# Patient Record
Sex: Male | Born: 1975 | Race: White | Hispanic: No | Marital: Married | Smoking: Current every day smoker
Health system: Southern US, Community
[De-identification: ages and names within clinical notes are randomized; demographics above are authoritative.]

## PROBLEM LIST (undated history)

## (undated) DIAGNOSIS — Z8711 Personal history of peptic ulcer disease: Secondary | ICD-10-CM

## (undated) DIAGNOSIS — Z8719 Personal history of other diseases of the digestive system: Secondary | ICD-10-CM

---

## 2007-01-27 ENCOUNTER — Ambulatory Visit: Payer: Self-pay | Admitting: Family Medicine

## 2010-01-31 ENCOUNTER — Ambulatory Visit: Payer: Self-pay | Admitting: Internal Medicine

## 2013-02-15 ENCOUNTER — Ambulatory Visit: Payer: Self-pay

## 2013-02-26 ENCOUNTER — Ambulatory Visit: Payer: Self-pay

## 2016-12-02 ENCOUNTER — Emergency Department
Admission: EM | Admit: 2016-12-02 | Discharge: 2016-12-02 | Disposition: A | Discharge status: Home / Self Care | Payer: Self-pay | Attending: Student in an Organized Health Care Education/Training Program | Admitting: Student in an Organized Health Care Education/Training Program

## 2016-12-02 ENCOUNTER — Encounter: Payer: Self-pay | Admitting: Emergency Medicine

## 2016-12-02 ENCOUNTER — Emergency Department: Payer: Self-pay

## 2016-12-02 DIAGNOSIS — F17200 Nicotine dependence, unspecified, uncomplicated: Secondary | ICD-10-CM | POA: Insufficient documentation

## 2016-12-02 DIAGNOSIS — K920 Hematemesis: Secondary | ICD-10-CM | POA: Insufficient documentation

## 2016-12-02 HISTORY — DX: Personal history of peptic ulcer disease: Z87.11

## 2016-12-02 HISTORY — DX: Personal history of other diseases of the digestive system: Z87.19

## 2016-12-02 LAB — COMPREHENSIVE METABOLIC PANEL
ALK PHOS: 50 U/L (ref 38–126)
ALT: 19 U/L (ref 17–63)
AST: 23 U/L (ref 15–41)
Albumin: 4.4 g/dL (ref 3.5–5.0)
Anion gap: 8 (ref 5–15)
BUN: 13 mg/dL (ref 6–20)
CALCIUM: 9.1 mg/dL (ref 8.9–10.3)
CO2: 25 mmol/L (ref 22–32)
CREATININE: 0.88 mg/dL (ref 0.61–1.24)
Chloride: 105 mmol/L (ref 101–111)
Glucose, Bld: 105 mg/dL — ABNORMAL HIGH (ref 65–99)
Potassium: 4 mmol/L (ref 3.5–5.1)
Sodium: 138 mmol/L (ref 135–145)
Total Bilirubin: 0.6 mg/dL (ref 0.3–1.2)
Total Protein: 7.1 g/dL (ref 6.5–8.1)

## 2016-12-02 LAB — CBC
HCT: 44.3 % (ref 40.0–52.0)
Hemoglobin: 15.3 g/dL (ref 13.0–18.0)
MCH: 32.1 pg (ref 26.0–34.0)
MCHC: 34.5 g/dL (ref 32.0–36.0)
MCV: 93 fL (ref 80.0–100.0)
PLATELETS: 234 10*3/uL (ref 150–440)
RBC: 4.76 MIL/uL (ref 4.40–5.90)
RDW: 13.2 % (ref 11.5–14.5)
WBC: 7.4 10*3/uL (ref 3.8–10.6)

## 2016-12-02 LAB — LIPASE, BLOOD: LIPASE: 22 U/L (ref 11–51)

## 2016-12-02 LAB — TYPE AND SCREEN
ABO/RH(D): A POS
Antibody Screen: NEGATIVE

## 2016-12-02 MED ORDER — GI COCKTAIL ~~LOC~~
ORAL | Status: AC
Start: 2016-12-02 — End: 2016-12-02
  Administered 2016-12-02: 30 mL via ORAL
  Filled 2016-12-02: qty 30

## 2016-12-02 MED ORDER — OMEPRAZOLE 20 MG PO CPDR: 20.0000 mg | DELAYED_RELEASE_CAPSULE | Freq: Two times a day (BID) | ORAL | 1 refills | Status: AC

## 2016-12-02 MED ORDER — GI COCKTAIL ~~LOC~~
30.0000 mL | Freq: Once | ORAL | Status: AC
  Administered 2016-12-02: 30 mL via ORAL

## 2016-12-02 NOTE — ED Notes (Signed)

## 2016-12-02 NOTE — ED Provider Notes (Signed)
[facility] Emergency Department Provider Note    None    (approximate)  I have reviewed the triage vital signs and the nursing notes.   HISTORY  Chief Complaint Hematemesis    HPI Ian Scott is a 41 y.o. male presents with long history of epigastric pain and gastritis presents with the episode of blood in his vomit. Patient states that he frequently vomits every morning. Drinks several cans of beer every night and does endorse smoking marijuana recreationally. Denies any pain at this time. Has not noted any blood in his stool or melena. He is not on any blood thinners. Does admit to smoking. Has undergone a lot of stress recently.  Does not have a history of heart failure. States she otherwise feels well and "healthy as a horse "    Past Medical History:  Diagnosis Date  . History of stomach ulcers    No family history on file. History reviewed. No pertinent surgical history. There are no active problems to display for this patient.     Prior to Admission medications   Not on File    Allergies Patient has no known allergies.    Social History Social History  Substance Use Topics  . Smoking status: Current Every Day Smoker  . Smokeless tobacco: Never Used  . Alcohol use Yes    Review of Systems Patient denies headaches, rhinorrhea, blurry vision, numbness, shortness of breath, chest pain, edema, cough, abdominal pain, nausea, vomiting, diarrhea, dysuria, fevers, rashes or hallucinations unless otherwise stated above in HPI. ____________________________________________   PHYSICAL EXAM:  VITAL SIGNS: Vitals:   12/02/16 1059  BP: 126/89  Pulse: 71  Resp: 18  Temp: 97.7 F (36.5 C)    Constitutional: Alert and oriented. Well appearing and in no acute distress. Eyes: Conjunctivae are normal.  Head: Atraumatic. Nose: No congestion/rhinnorhea. Mouth/Throat: Mucous membranes are moist.   Neck: No stridor. Painless ROM.    Cardiovascular: Normal rate, regular rhythm. Grossly normal heart sounds.  Good peripheral circulation. Respiratory: Normal respiratory effort.  No retractions. Lungs CTAB. Gastrointestinal: Soft and nontender. No distention. No abdominal bruits. No CVA tenderness. Genitourinary:  Musculoskeletal: No lower extremity tenderness nor edema.  No joint effusions. Neurologic:  Normal speech and language. No gross focal neurologic deficits are appreciated. No facial droop Skin:  Skin is warm, dry and intact. No rash noted. Psychiatric: Mood and affect are normal. Speech and behavior are normal.  ____________________________________________   LABS (all labs ordered are listed, but only abnormal results are displayed)  Results for orders placed or performed during the hospital encounter of 12/02/16 (from the past 24 hour(s))  Comprehensive metabolic panel     Status: Abnormal   Collection Time: 12/02/16 11:05 AM  Result Value Ref Range   Sodium 138 135 - 145 mmol/L   Potassium 4.0 3.5 - 5.1 mmol/L   Chloride 105 101 - 111 mmol/L   CO2 25 22 - 32 mmol/L   Glucose, Bld 105 (H) 65 - 99 mg/dL   BUN 13 6 - 20 mg/dL   Creatinine, Ser 1.61 0.61 - 1.24 mg/dL   Calcium 9.1 8.9 - 09.6 mg/dL   Total Protein 7.1 6.5 - 8.1 g/dL   Albumin 4.4 3.5 - 5.0 g/dL   AST 23 15 - 41 U/L   ALT 19 17 - 63 U/L   Alkaline Phosphatase 50 38 - 126 U/L   Total Bilirubin 0.6 0.3 - 1.2 mg/dL   GFR calc non Af  Amer >60 >60 mL/min   GFR calc Af Amer >60 >60 mL/min   Anion gap 8 5 - 15  CBC     Status: None   Collection Time: 12/02/16 11:05 AM  Result Value Ref Range   WBC 7.4 3.8 - 10.6 K/uL   RBC 4.76 4.40 - 5.90 MIL/uL   Hemoglobin 15.3 13.0 - 18.0 g/dL   HCT 16.144.3 09.640.0 - 04.552.0 %   MCV 93.0 80.0 - 100.0 fL   MCH 32.1 26.0 - 34.0 pg   MCHC 34.5 32.0 - 36.0 g/dL   RDW 40.913.2 81.111.5 - 91.414.5 %   Platelets 234 150 - 440 K/uL  Type and screen [facility]     Status: None   Collection Time: 12/02/16  11:05 AM  Result Value Ref Range   ABO/RH(D) A POS    Antibody Screen NEG    Sample Expiration 12/05/2016    ____________________________________________ ____________________________________________  RADIOLOGY  I personally reviewed all radiographic images ordered to evaluate for the above acute complaints and reviewed radiology reports and findings.  These findings were personally discussed with the patient.  Please see medical record for radiology report.  ____________________________________________   PROCEDURES  Procedure(s) performed:  Procedures    Critical Care performed: no ____________________________________________   INITIAL IMPRESSION / ASSESSMENT AND PLAN / ED COURSE  Pertinent labs & imaging results that were available during my care of the patient were reviewed by me and considered in my medical decision making (see chart for details).  DDX: gastritis, pacnreatitis, pud, esophagitis, hepatitis   Ian Scott is a 41 y.o. who presents to the ED with Nausea vomiting and hematemesis as described above. He is afebrile and very well-appearing clinically likely secondary to alcohol use and tobacco use. Patient does have risk factors for peptic ulcer disease and also admits to increased stress however he does not have evidence of active bleeding at this time.  His Glasgow-Blatchford score is 0.  No evidence of pneumomediastinum. Patient had some improvement in symptoms after GI cocktail. Patient will be started on Prilosec and referred to GI for further evaluation.  10 minutes of smoking and alcohol cessation counseling provided.  Have discussed with the patient and available family all diagnostics and treatments performed thus far and all questions were answered to the best of my ability. The patient demonstrates understanding and agreement with plan.       ____________________________________________   FINAL CLINICAL IMPRESSION(S) / ED DIAGNOSES  Final  diagnoses:  Hematemesis with nausea      NEW MEDICATIONS STARTED DURING THIS VISIT:  New Prescriptions   No medications on file     Note:  This document was prepared using Dragon voice recognition software and may include unintentional dictation errors.    Willy Eddyobinson, Raahi Korber, MD 12/02/16 [phone]

## 2016-12-02 NOTE — ED Triage Notes (Signed)
Vomiting every morning x4 days with blood tinge, today large amount emesis, stomach pain

## 2021-04-25 ENCOUNTER — Emergency Department: Payer: Self-pay

## 2021-04-25 ENCOUNTER — Emergency Department
Admission: EM | Admit: 2021-04-25 | Discharge: 2021-04-25 | Disposition: A | Discharge status: Other Acute Inpatient Hospital | Payer: Self-pay | Attending: Emergency Medicine | Admitting: Emergency Medicine

## 2021-04-25 DIAGNOSIS — S82402B Unspecified fracture of shaft of left fibula, initial encounter for open fracture type I or II: Secondary | ICD-10-CM | POA: Insufficient documentation

## 2021-04-25 DIAGNOSIS — S82401B Unspecified fracture of shaft of right fibula, initial encounter for open fracture type I or II: Secondary | ICD-10-CM

## 2021-04-25 DIAGNOSIS — F17200 Nicotine dependence, unspecified, uncomplicated: Secondary | ICD-10-CM | POA: Insufficient documentation

## 2021-04-25 DIAGNOSIS — S82201B Unspecified fracture of shaft of right tibia, initial encounter for open fracture type I or II: Secondary | ICD-10-CM | POA: Insufficient documentation

## 2021-04-25 DIAGNOSIS — Z23 Encounter for immunization: Secondary | ICD-10-CM | POA: Insufficient documentation

## 2021-04-25 DIAGNOSIS — T148XXA Other injury of unspecified body region, initial encounter: Secondary | ICD-10-CM

## 2021-04-25 DIAGNOSIS — Y92410 Unspecified street and highway as the place of occurrence of the external cause: Secondary | ICD-10-CM | POA: Insufficient documentation

## 2021-04-25 DIAGNOSIS — Z20822 Contact with and (suspected) exposure to covid-19: Secondary | ICD-10-CM | POA: Insufficient documentation

## 2021-04-25 LAB — CBC WITH DIFFERENTIAL/PLATELET
Abs Immature Granulocytes: 0.15 10*3/uL — ABNORMAL HIGH (ref 0.00–0.07)
Basophils Absolute: 0.2 10*3/uL — ABNORMAL HIGH (ref 0.0–0.1)
Basophils Relative: 1 %
Eosinophils Absolute: 0.2 10*3/uL (ref 0.0–0.5)
Eosinophils Relative: 1 %
HCT: 41 % (ref 39.0–52.0)
Hemoglobin: 13.9 g/dL (ref 13.0–17.0)
Immature Granulocytes: 1 %
Lymphocytes Relative: 20 %
Lymphs Abs: 4.4 10*3/uL — ABNORMAL HIGH (ref 0.7–4.0)
MCH: 32.2 pg (ref 26.0–34.0)
MCHC: 33.9 g/dL (ref 30.0–36.0)
MCV: 94.9 fL (ref 80.0–100.0)
Monocytes Absolute: 1.1 10*3/uL — ABNORMAL HIGH (ref 0.1–1.0)
Monocytes Relative: 5 %
Neutro Abs: 16.2 10*3/uL — ABNORMAL HIGH (ref 1.7–7.7)
Neutrophils Relative %: 72 %
Platelets: 310 10*3/uL (ref 150–400)
RBC: 4.32 MIL/uL (ref 4.22–5.81)
RDW: 11.9 % (ref 11.5–15.5)
WBC: 22.2 10*3/uL — ABNORMAL HIGH (ref 4.0–10.5)
nRBC: 0.2 % (ref 0.0–0.2)

## 2021-04-25 LAB — COMPREHENSIVE METABOLIC PANEL
ALT: 16 U/L (ref 0–44)
AST: 28 U/L (ref 15–41)
Albumin: 4.2 g/dL (ref 3.5–5.0)
Alkaline Phosphatase: 56 U/L (ref 38–126)
Anion gap: 11 (ref 5–15)
BUN: 11 mg/dL (ref 6–20)
CO2: 20 mmol/L — ABNORMAL LOW (ref 22–32)
Calcium: 8.4 mg/dL — ABNORMAL LOW (ref 8.9–10.3)
Chloride: 105 mmol/L (ref 98–111)
Creatinine, Ser: 1.03 mg/dL (ref 0.61–1.24)
GFR, Estimated: >60 mL/min (ref >60)
Glucose, Bld: 122 mg/dL — ABNORMAL HIGH (ref 70–99)
Potassium: 3.8 mmol/L (ref 3.5–5.1)
Sodium: 136 mmol/L (ref 135–145)
Total Bilirubin: 0.6 mg/dL (ref 0.3–1.2)
Total Protein: 7 g/dL (ref 6.5–8.1)

## 2021-04-25 LAB — TYPE AND SCREEN
ABO/RH(D): A POS
Antibody Screen: NEGATIVE

## 2021-04-25 LAB — RESP PANEL BY RT-PCR (FLU A&B, COVID) ARPGX2
Influenza A by PCR: NEGATIVE
Influenza B by PCR: NEGATIVE
SARS Coronavirus 2 by RT PCR: NEGATIVE

## 2021-04-25 MED ORDER — HYDROMORPHONE HCL 1 MG/ML IJ SOLN
0.5000 mg | Freq: Once | INTRAMUSCULAR | Status: AC
  Administered 2021-04-25: 0.5 mg via INTRAVENOUS
  Filled 2021-04-25: qty 1

## 2021-04-25 MED ORDER — ONDANSETRON HCL 4 MG/2ML IJ SOLN
4.0000 mg | Freq: Once | INTRAMUSCULAR | Status: AC
  Administered 2021-04-25: 4 mg via INTRAVENOUS
  Filled 2021-04-25: qty 2

## 2021-04-25 MED ORDER — HYDROMORPHONE HCL 1 MG/ML IJ SOLN
1.0000 mg | Freq: Once | INTRAMUSCULAR | Status: AC
  Administered 2021-04-25: 1 mg via INTRAVENOUS
  Filled 2021-04-25: qty 1

## 2021-04-25 MED ORDER — CEFAZOLIN SODIUM-DEXTROSE 2-4 GM/100ML-% IV SOLN
2.0000 g | Freq: Once | INTRAVENOUS | Status: AC
  Administered 2021-04-25: 2 g via INTRAVENOUS
  Filled 2021-04-25: qty 100

## 2021-04-25 MED ORDER — TETANUS-DIPHTH-ACELL PERTUSSIS 5-2.5-18.5 LF-MCG/0.5 IM SUSY
0.5000 mL | PREFILLED_SYRINGE | Freq: Once | INTRAMUSCULAR | Status: AC
  Administered 2021-04-25: 0.5 mL via INTRAMUSCULAR
  Filled 2021-04-25: qty 0.5
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
## 2021-04-25 NOTE — ED Notes (Signed)
EMTALA reviewed by this RN.  
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
## 2021-04-25 NOTE — ED Notes (Signed)
Splint applied for pt transfer. Pulses with doppler present and pt able to move toes.
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
## 2023-02-06 IMAGING — DX DG TIBIA/FIBULA 2V*R*
4 series · 4 of 4 positions shown · non-contrast
Comparison: Right tib-fib x-ray 02/15/2013

CLINICAL DATA: Trauma

EXAM:
RIGHT TIBIA AND FIBULA - 2 VIEW

[tibia ap (1 of 2)]
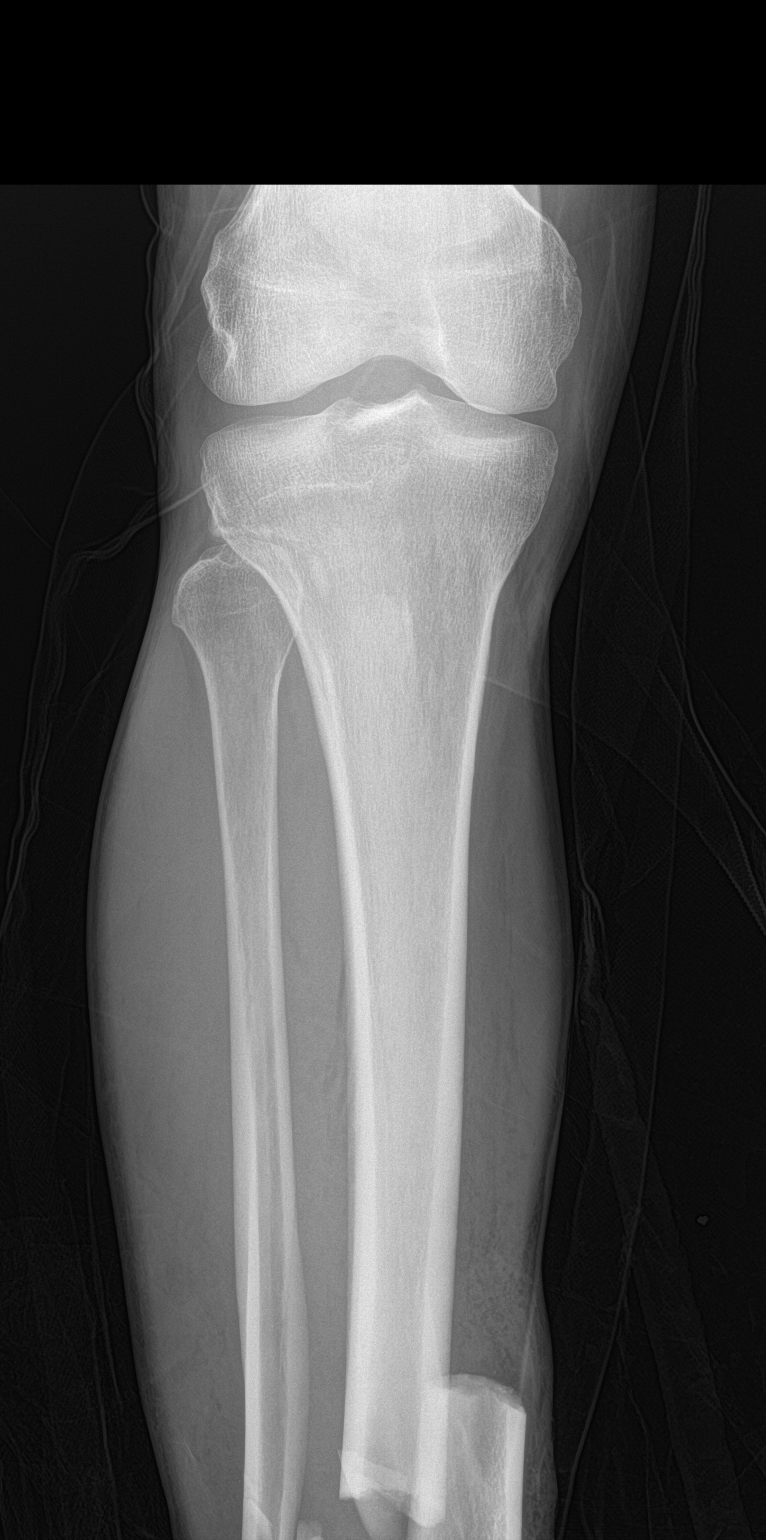

[tibia ap (2 of 2)]
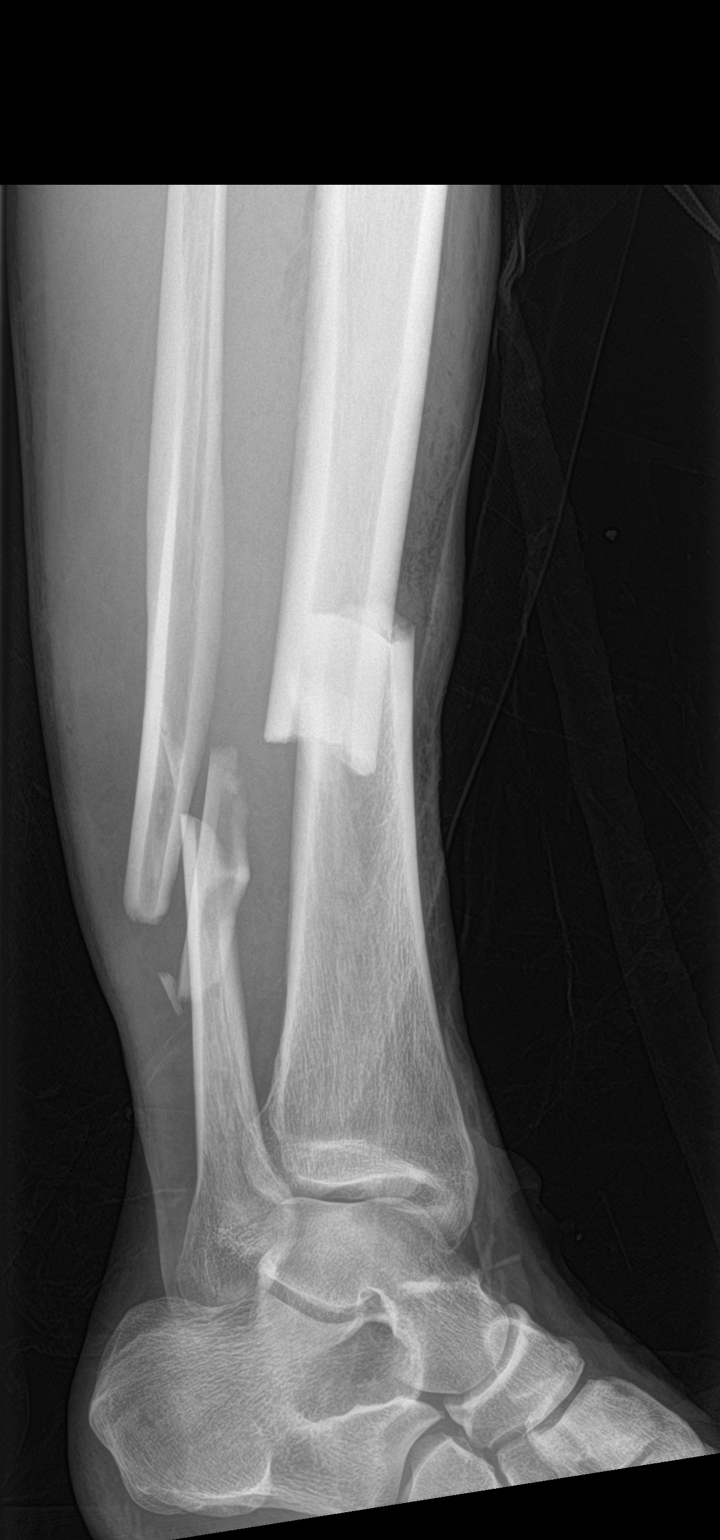

[tibia lat (1 of 2)]
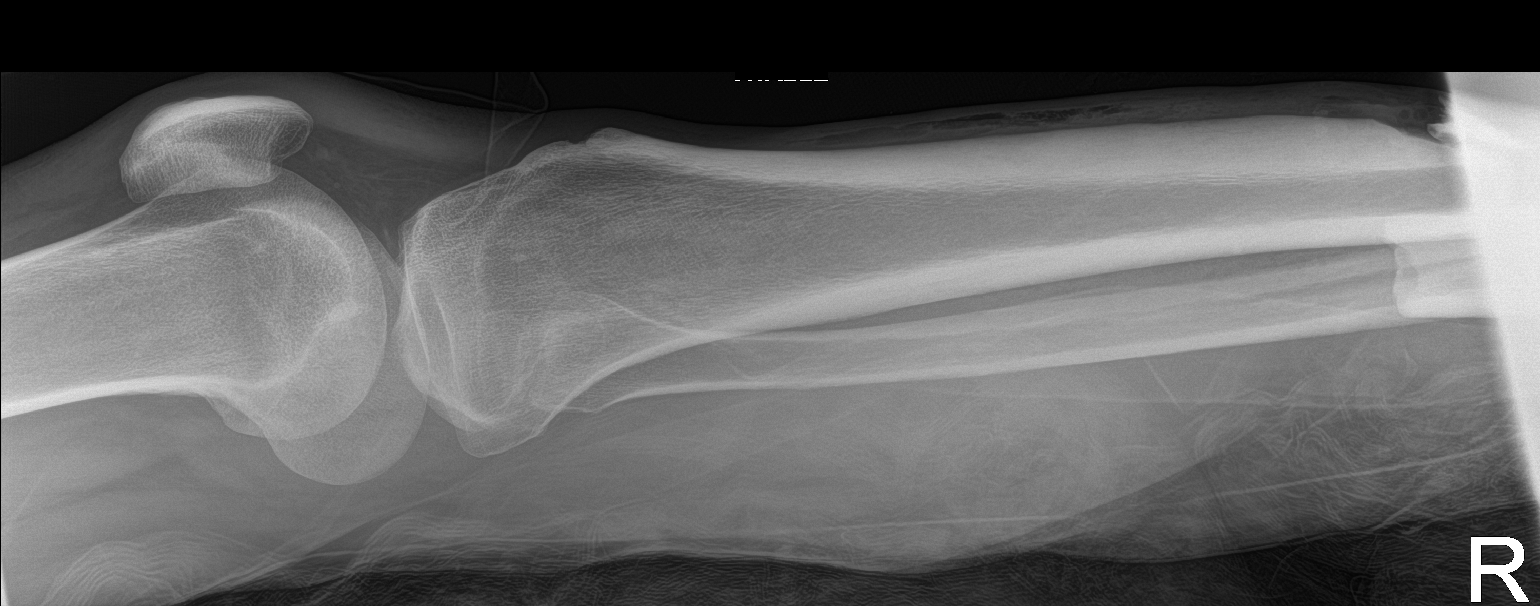

[tibia lat (2 of 2)]
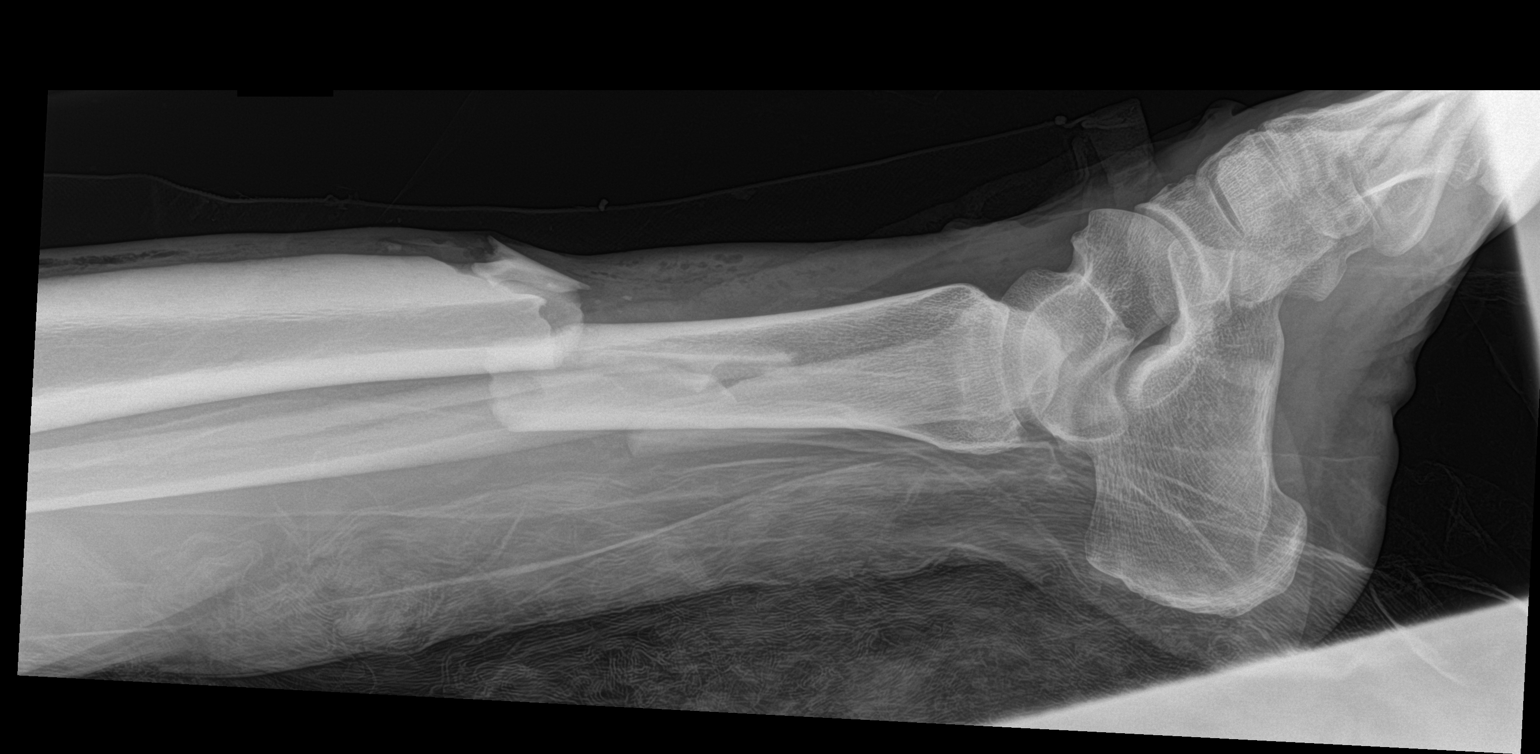

[4 of 4 positions shown; findings below may reference images not displayed]

FINDINGS: Acute comminuted fracture of the distal diaphysis of the fibula with
displacement of fragments including approximately 1 shaft width
dorsal displacement of the distal fragment.

Acute mildly comminuted fracture of the distal diaphysis of the
tibia with approximately 1 shaft width medial and dorsal
displacement of the distal fragment.

Associated soft tissue swelling, deformity and subcutaneous
lucencies/emphysema in the anterior lower leg.
IMPRESSION: Acute comminuted and displaced open fractures of the distal tibia
and fibula.
